# Patient Record
Sex: Male | Born: 1990 | Race: White | Hispanic: No | Marital: Single | Smoking: Current every day smoker
Health system: Southern US, Community
[De-identification: ages and names within clinical notes are randomized; demographics above are authoritative.]

---
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
## 2020-01-22 ENCOUNTER — Emergency Department (HOSPITAL_COMMUNITY)
Admission: EM | Admit: 2020-01-22 | Discharge: 2020-01-22 | Disposition: A | Discharge status: Home / Self Care | Payer: No Typology Code available for payment source | Attending: Emergency Medicine | Admitting: Emergency Medicine

## 2020-01-22 ENCOUNTER — Emergency Department (HOSPITAL_COMMUNITY): Payer: No Typology Code available for payment source

## 2020-01-22 ENCOUNTER — Encounter (HOSPITAL_COMMUNITY): Payer: Self-pay | Admitting: Emergency Medicine

## 2020-01-22 ENCOUNTER — Other Ambulatory Visit: Payer: Self-pay

## 2020-01-22 DIAGNOSIS — N23 Unspecified renal colic: Secondary | ICD-10-CM

## 2020-01-22 DIAGNOSIS — F17210 Nicotine dependence, cigarettes, uncomplicated: Secondary | ICD-10-CM | POA: Insufficient documentation

## 2020-01-22 DIAGNOSIS — R1030 Lower abdominal pain, unspecified: Secondary | ICD-10-CM | POA: Diagnosis present

## 2020-01-22 LAB — URINALYSIS, ROUTINE W REFLEX MICROSCOPIC
Bilirubin Urine: NEGATIVE
Glucose, UA: NEGATIVE mg/dL
Ketones, ur: 20 mg/dL — AB
Leukocytes,Ua: NEGATIVE
Nitrite: NEGATIVE
Protein, ur: 100 mg/dL — AB
RBC / HPF: >50 RBC/hpf — ABNORMAL HIGH (ref 0–5)
Specific Gravity, Urine: 1.026 (ref 1.005–1.030)
pH: 6 (ref 5.0–8.0)

## 2020-01-22 LAB — COMPREHENSIVE METABOLIC PANEL
ALT: 25 U/L (ref 0–44)
AST: 29 U/L (ref 15–41)
Albumin: 5.2 g/dL — ABNORMAL HIGH (ref 3.5–5.0)
Alkaline Phosphatase: 52 U/L (ref 38–126)
Anion gap: 8 (ref 5–15)
BUN: 21 mg/dL — ABNORMAL HIGH (ref 6–20)
CO2: 27 mmol/L (ref 22–32)
Calcium: 9.4 mg/dL (ref 8.9–10.3)
Chloride: 104 mmol/L (ref 98–111)
Creatinine, Ser: 1.02 mg/dL (ref 0.61–1.24)
GFR calc Af Amer: >60 mL/min (ref >60)
GFR calc non Af Amer: >60 mL/min (ref >60)
Glucose, Bld: 94 mg/dL (ref 70–99)
Potassium: 3.9 mmol/L (ref 3.5–5.1)
Sodium: 139 mmol/L (ref 135–145)
Total Bilirubin: 0.9 mg/dL (ref 0.3–1.2)
Total Protein: 8.2 g/dL — ABNORMAL HIGH (ref 6.5–8.1)

## 2020-01-22 LAB — CBC
HCT: 43.5 % (ref 39.0–52.0)
Hemoglobin: 14.9 g/dL (ref 13.0–17.0)
MCH: 30.5 pg (ref 26.0–34.0)
MCHC: 34.3 g/dL (ref 30.0–36.0)
MCV: 89 fL (ref 80.0–100.0)
Platelets: 270 10*3/uL (ref 150–400)
RBC: 4.89 MIL/uL (ref 4.22–5.81)
RDW: 12.8 % (ref 11.5–15.5)
WBC: 19.6 10*3/uL — ABNORMAL HIGH (ref 4.0–10.5)
nRBC: 0 % (ref 0.0–0.2)

## 2020-01-22 LAB — LIPASE, BLOOD: Lipase: 29 U/L (ref 11–51)

## 2020-01-22 MED ORDER — SODIUM CHLORIDE 0.9 % IV BOLUS
1000.0000 mL | Freq: Once | INTRAVENOUS | Status: AC
  Administered 2020-01-22: 1000 mL via INTRAVENOUS

## 2020-01-22 MED ORDER — OXYCODONE-ACETAMINOPHEN 5-325 MG PO TABS
1.0000 | ORAL_TABLET | Freq: Once | ORAL | Status: AC
  Administered 2020-01-22: 1 via ORAL
  Filled 2020-01-22: qty 1

## 2020-01-22 MED ORDER — ONDANSETRON HCL 4 MG PO TABS: 4.0000 mg | ORAL_TABLET | Freq: Three times a day (TID) | ORAL | 0 refills | Status: AC | PRN

## 2020-01-22 MED ORDER — TAMSULOSIN HCL 0.4 MG PO CAPS: 0.4000 mg | ORAL_CAPSULE | Freq: Every day | ORAL | 0 refills | Status: AC

## 2020-01-22 MED ORDER — KETOROLAC TROMETHAMINE 15 MG/ML IJ SOLN
15.0000 mg | Freq: Once | INTRAMUSCULAR | Status: AC
  Administered 2020-01-22: 15 mg via INTRAVENOUS
  Filled 2020-01-22: qty 1

## 2020-01-22 MED ORDER — ONDANSETRON HCL 4 MG/2ML IJ SOLN
4.0000 mg | Freq: Once | INTRAMUSCULAR | Status: AC
  Administered 2020-01-22: 4 mg via INTRAVENOUS
  Filled 2020-01-22: qty 2

## 2020-01-22 MED ORDER — HYDROCODONE-ACETAMINOPHEN 5-325 MG PO TABS: 2.0000 | ORAL_TABLET | ORAL | 0 refills | Status: AC | PRN

## 2020-01-22 NOTE — ED Provider Notes (Signed)
[facility]-EMERGENCY DEPT Provider Note   CSN: [id number] Arrival date & time: 01/22/20  1925     History Chief Complaint  Patient presents with  . Hematuria    Tyler Haas is a 29 y.o. male.  The history is provided by the patient. No language interpreter was used.  Hematuria   Tyler Haas is a 29 y.o. male who presents to the Emergency Department complaining of abdominal pain.  He started feeling poorly this morning with nausea.  Later in the day he began to develop left testicular pain ( around 9am).  Pain then began to worsen.  His urine started looking dark/bloody.  No dysuria.  No penile discharge.  No new sexual partners.  Sxs are severe and constant in nature.    Vapes.  No alcohol, drugs.      Past Medical History:  Diagnosis Date  . Migraine     There are no problems to display for this patient.   History reviewed. No pertinent surgical history.     No family history on file.  Social History   Tobacco Use  . Smoking status: Current Every Day Smoker    Packs/day: 0.50  Substance Use Topics  . Alcohol use: No  . Drug use: Yes    Types: Marijuana    Home Medications Prior to Admission medications   Medication Sig Start Date End Date Taking? Authorizing Provider  HYDROcodone-acetaminophen (NORCO/VICODIN) 5-325 MG tablet Take 2 tablets by mouth every 4 (four) hours as needed. 01/22/20   Tilden Fossa, MD  ketoprofen (ORUDIS) 50 MG capsule Take 50 mg by mouth every 4 (four) hours as needed. For migraine     [provider]  ondansetron (ZOFRAN) 4 MG tablet Take 1 tablet (4 mg total) by mouth every 8 (eight) hours as needed for nausea or vomiting. 01/22/20   Tilden Fossa, MD  tamsulosin (FLOMAX) 0.4 MG CAPS capsule Take 1 capsule (0.4 mg total) by mouth daily. 01/22/20   Tilden Fossa, MD    Allergies    Patient has no known allergies.  Review of Systems   Review of Systems  Genitourinary: Positive for  hematuria.  All other systems reviewed and are negative.   Physical Exam Updated Vital Signs BP 128/75 (BP Location: Left Arm)   Pulse (!) 50   Temp 98.8 F (37.1 C)   Resp 15   SpO2 99%   Physical Exam Vitals and nursing note reviewed.  Constitutional:      Appearance: He is well-developed.  HENT:     Head: Normocephalic and atraumatic.  Cardiovascular:     Rate and Rhythm: Normal rate and regular rhythm.     Heart sounds: No murmur heard.   Pulmonary:     Effort: Pulmonary effort is normal. No respiratory distress.     Breath sounds: Normal breath sounds.  Abdominal:     Palpations: Abdomen is soft.     Tenderness: There is no guarding or rebound.     Comments: Mild LLQ tenderness  Genitourinary:    Penis: Normal.      Testes: Normal.  Musculoskeletal:        General: No tenderness.  Skin:    General: Skin is warm and dry.  Neurological:     Mental Status: He is alert and oriented to person, place, and time.  Psychiatric:        Behavior: Behavior normal.     ED Results / Procedures / Treatments   Labs (all labs  ordered are listed, but only abnormal results are displayed) Labs Reviewed  COMPREHENSIVE METABOLIC PANEL - Abnormal; Notable for the following components:      Result Value   BUN 21 (*)    Total Protein 8.2 (*)    Albumin 5.2 (*)    All other components within normal limits  CBC - Abnormal; Notable for the following components:   WBC 19.6 (*)    All other components within normal limits  URINALYSIS, ROUTINE W REFLEX MICROSCOPIC - Abnormal; Notable for the following components:   APPearance CLOUDY (*)    Hgb urine dipstick LARGE (*)    Ketones, ur 20 (*)    Protein, ur 100 (*)    RBC / HPF >50 (*)    Bacteria, UA RARE (*)    All other components within normal limits  URINE CULTURE  LIPASE, BLOOD    EKG None  Radiology CT Renal Stone Study  Result Date: 01/22/2020 CLINICAL DATA:  Hematuria, abdominal pain, nausea, left testicular  pain EXAM: CT ABDOMEN AND PELVIS WITHOUT CONTRAST TECHNIQUE: Multidetector CT imaging of the abdomen and pelvis was performed following the standard protocol without IV contrast. COMPARISON:  01/22/2020 FINDINGS: Lower chest: No acute pleural or parenchymal lung disease. Hepatobiliary: No focal liver abnormality is seen. No gallstones, gallbladder wall thickening, or biliary dilatation. Pancreas: Unremarkable. No pancreatic ductal dilatation or surrounding inflammatory changes. Spleen: Normal in size without focal abnormality. Adrenals/Urinary Tract: There is a 2 mm obstructing proximal left ureteral calculus at the level of the UPJ, reference image 44/2. No other left-sided calculi. The right kidney is unremarkable. The adrenals are normal. Bladder is minimally distended with no filling defect. Stomach/Bowel: No bowel obstruction or ileus. No wall thickening or inflammatory change. Normal appendix right lower quadrant. Vascular/Lymphatic: No significant vascular findings are present. No enlarged abdominal or pelvic lymph nodes. Reproductive: Prostate is unremarkable. Other: No abdominal wall hernia or abnormality. No abdominopelvic ascites. Musculoskeletal: No acute or destructive bony lesions. 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 IMPRESSION: 1. Mild left-sided obstructive uropathy due to a 2 mm left UPJ calculus. 2. Otherwise no acute intra-abdominal or intrapelvic process. Electronically Signed   By: Sharlet Salina M.D.   On: 01/22/2020 22:36   US SCROTUM W/DOPPLER  Result Date: 01/22/2020 CLINICAL DATA:  Left testicular pain and hematuria. EXAM: SCROTAL ULTRASOUND DOPPLER ULTRASOUND OF THE TESTICLES TECHNIQUE: Complete ultrasound examination of the testicles, epididymis, and other scrotal structures was performed. Color and spectral Doppler ultrasound were also utilized to evaluate blood flow to the  testicles. COMPARISON:  None. FINDINGS: Right testicle Measurements: 4.8 x 2.1 x 2.8 cm. Volume 15 cubic cm. Normal appearance without evidence of mass or inflammatory disease. Normal color flow. Left testicle Measurements:  4.3 x 2.1 x 2.8 cm.  Volume 13 cubic cm. Normal appearance without evidence of mass or inflammatory disease. Normal color flow. Right epididymis:  Normal in size and appearance. Left epididymis:  Normal in size and appearance. Hydrocele:  None visualized. Varicocele:  None visualized. Pulsed Doppler interrogation of both testes demonstrates normal low resistance arterial and venous waveforms bilaterally. IMPRESSION: Normal scrotal ultrasound. Symmetric testicles and epididymi. No mass, cyst, hyperemia, hydrocele or varicocele. Electronically Signed   By: Paulina Fusi M.D.   On: 01/22/2020 21:37    Procedures Procedures (including critical care time)  Medications Ordered in ED Medications  sodium chloride 0.9 % bolus 1,000 mL (1,000 mLs Intravenous New  Bag/Given 01/22/20 2208)  ketorolac (TORADOL) 15 MG/ML injection 15 mg (15 mg Intravenous Given 01/22/20 2208)  ondansetron (ZOFRAN) injection 4 mg (4 mg Intravenous Given 01/22/20 2207)  oxyCODONE-acetaminophen (PERCOCET/ROXICET) 5-325 MG per tablet 1 tablet (1 tablet Oral Given 01/22/20 2310)    ED Course  I have reviewed the triage vital signs and the nursing notes.  Pertinent labs & imaging results that were available during my care of the patient were reviewed by me and considered in my medical decision making (see chart for details).    MDM Rules/Calculators/A&P                         Patient here for evaluation of left testicle pain and lower abdominal pain. He is non-toxic appearing on evaluation. CBC with leukocytosis. UA not consistent with UTI. Testicular ultrasound is negative for acute abnormality. CT stone study demonstrates a 2 mm distal ureteral stone. Discussed with patient findings of studies. Following  treatment in the emergency department he is feeling improved. Discussed home care for stone, outpatient follow-up, return precautions.  Final Clinical Impression(s) / ED Diagnoses Final diagnoses:  Renal colic on left side    Rx / DC Orders ED Discharge Orders         Ordered    tamsulosin (FLOMAX) 0.4 MG CAPS capsule  Daily     Discontinue  Reprint     01/22/20 2316    HYDROcodone-acetaminophen (NORCO/VICODIN) 5-325 MG tablet  Every 4 hours PRN     Discontinue  Reprint     01/22/20 2316    ondansetron (ZOFRAN) 4 MG tablet  Every 8 hours PRN     Discontinue  Reprint     01/22/20 2316           Tilden Fossa, MD 01/22/20 2317

## 2020-01-22 NOTE — Discharge Instructions (Addendum)
You have a 2 mm stone in your left ureter. Get rechecked immediately if you develop fevers, cannot urinate or develop uncontrolled pain.

## 2020-01-22 NOTE — ED Notes (Signed)
Patient given strainer for urine.

## 2020-01-22 NOTE — ED Triage Notes (Signed)
Patient reports hematuria with abdominal pain and nausea today. Also reports pain to left testicle today.

## 2020-01-23 LAB — URINE CULTURE: Culture: <10000 — AB

## 2021-12-21 IMAGING — CT CT RENAL STONE PROTOCOL
2 of 4 series · 16 of 46 positions shown, 18 images · non-contrast
Comparison: 01/22/2020

CLINICAL DATA: Hematuria, abdominal pain, nausea, left testicular
pain

EXAM:
CT ABDOMEN AND PELVIS WITHOUT CONTRAST
TECHNIQUE: Multidetector CT imaging of the abdomen and pelvis was performed
following the standard protocol without IV contrast.

[Series 2: axial st · axial · 0.85mm/px · z∈[+1012,+1492]mm · 13 of 108 slices shown, 15 images]
[im 6/108  soft-tissue]
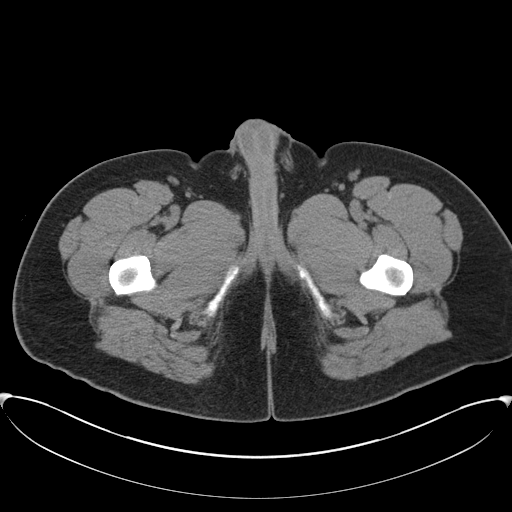
[im 6/108  bone]
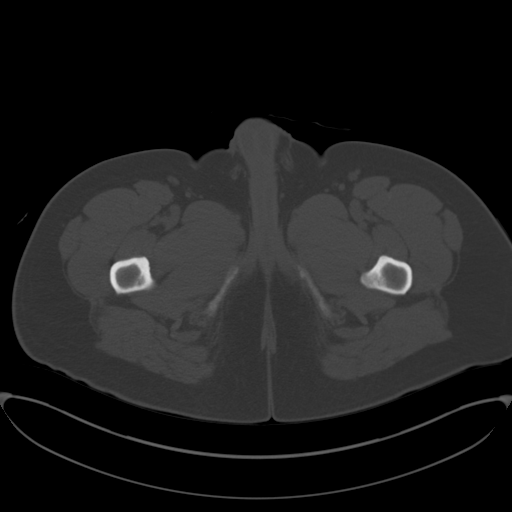
[im 17/108  soft-tissue]
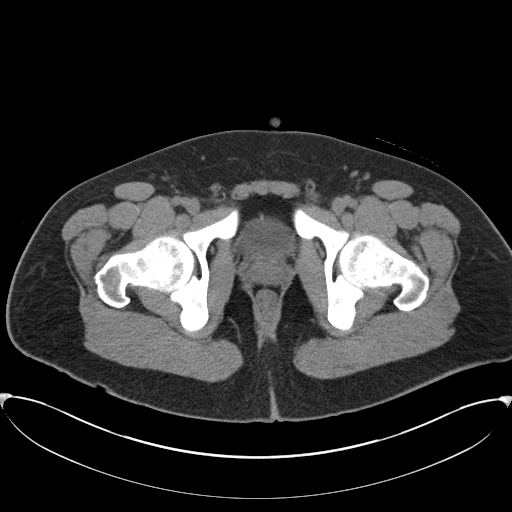
[im 23/108  soft-tissue]
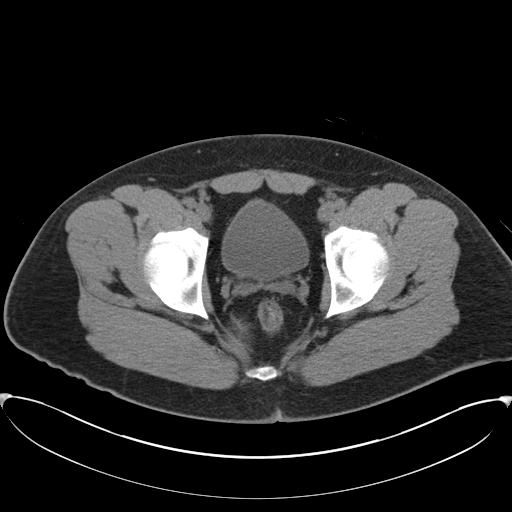
[im 29/108  soft-tissue]
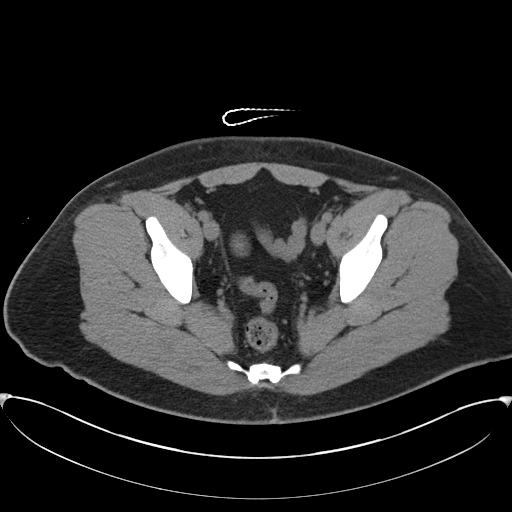
[im 40/108  soft-tissue]
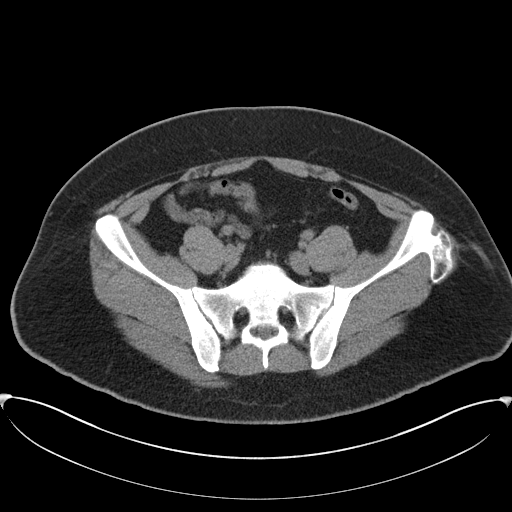
[im 46/108  soft-tissue]
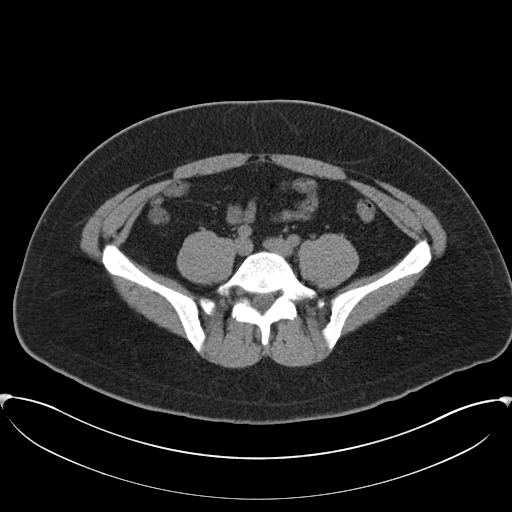
[im 57/108  soft-tissue]
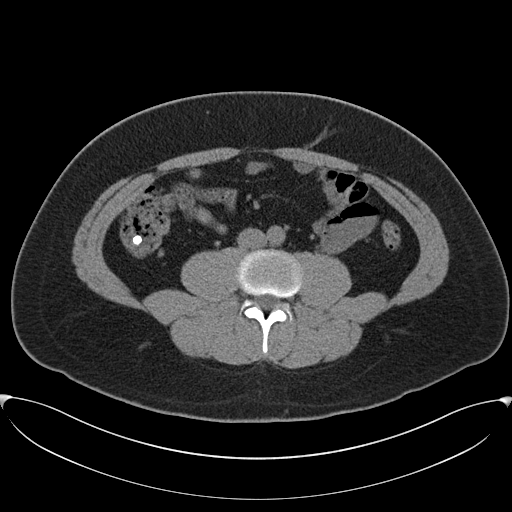
[im 62/108  soft-tissue]
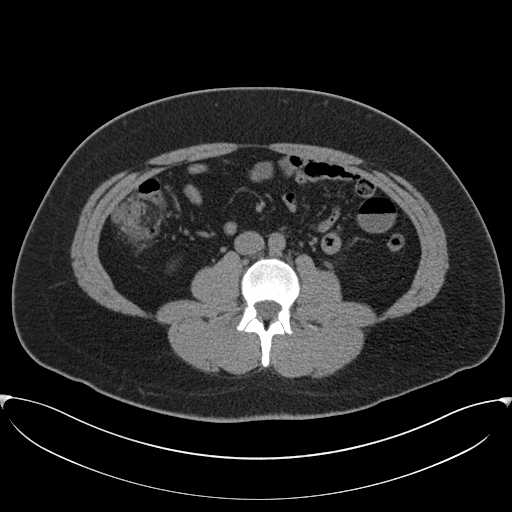
[im 68/108  soft-tissue]
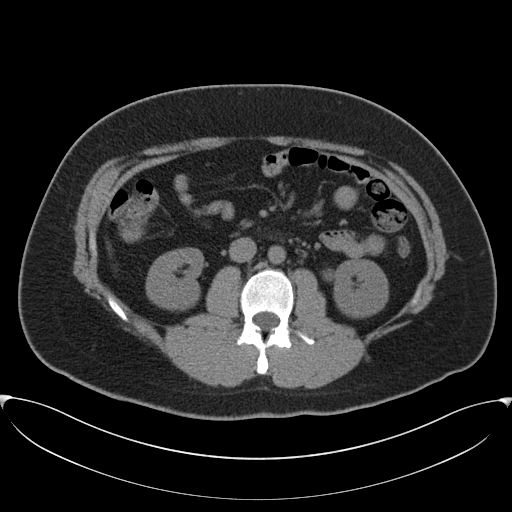
[im 68/108  bone]
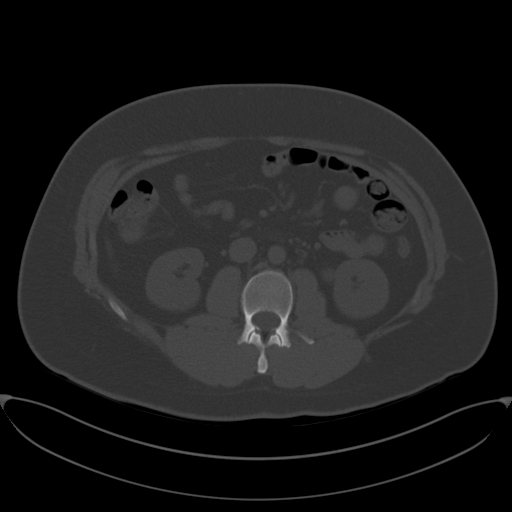
[im 79/108  soft-tissue]
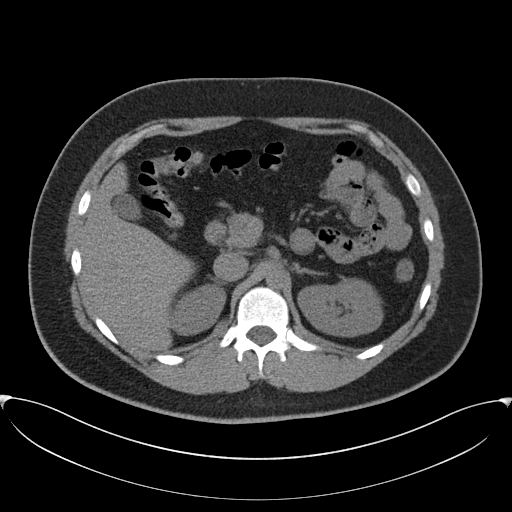
[im 85/108  soft-tissue]
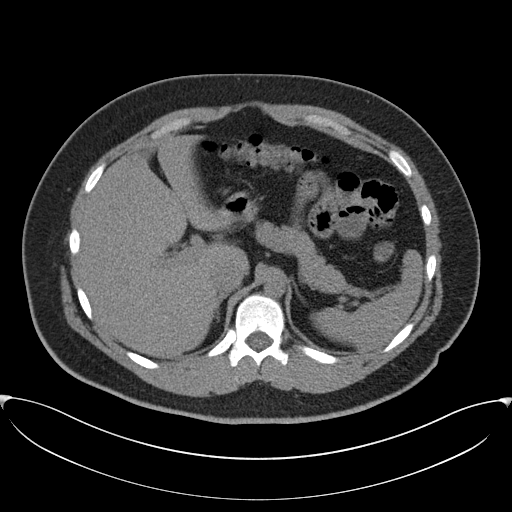
[im 91/108  soft-tissue]
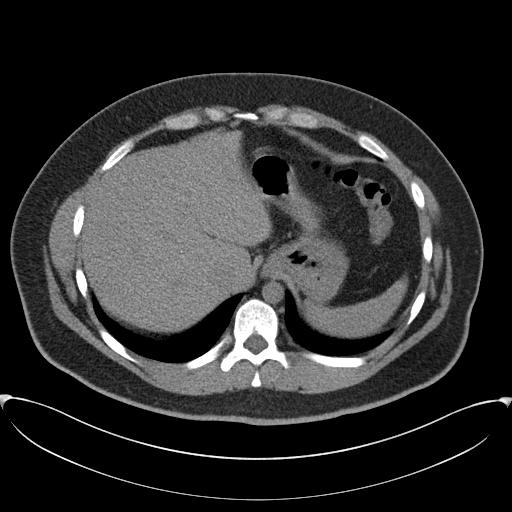
[im 102/108  soft-tissue]
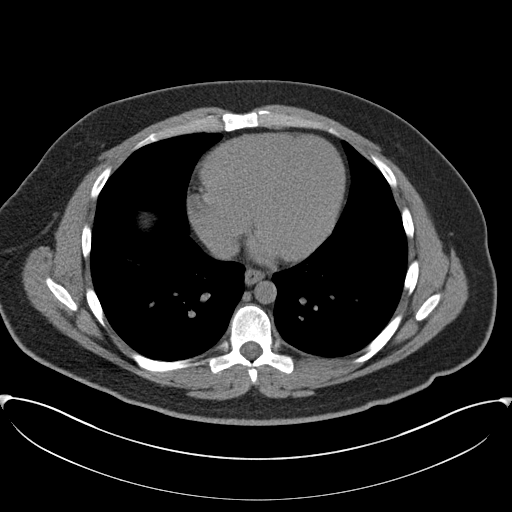

[Series 5: coronal · coronal · 0.80mm/px · 3 of 151 slices shown]
[im 51/151  soft-tissue]
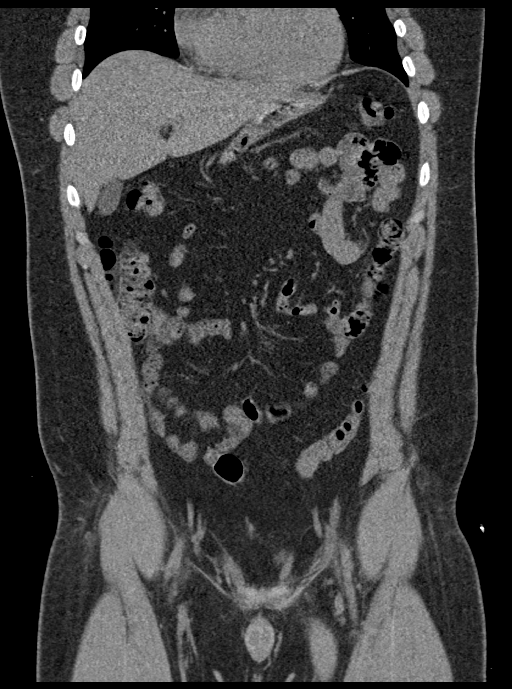
[im 67/151  soft-tissue]
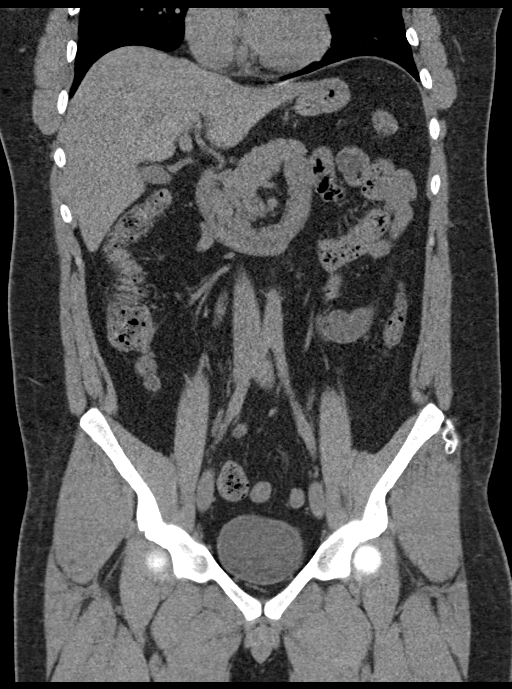
[im 84/151  soft-tissue]
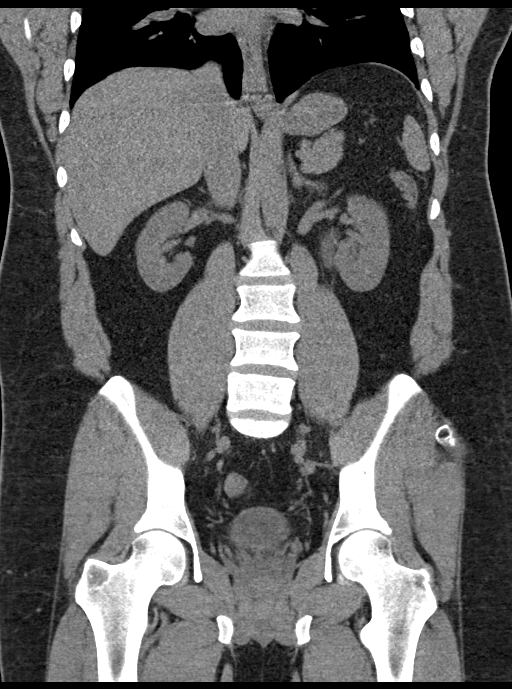

[16 of 46 positions shown; findings below may reference images not displayed]

FINDINGS: Lower chest: No acute pleural or parenchymal lung disease.

Hepatobiliary: No focal liver abnormality is seen. No gallstones,
gallbladder wall thickening, or biliary dilatation.

Pancreas: Unremarkable. No pancreatic ductal dilatation or
surrounding inflammatory changes.

Spleen: Normal in size without focal abnormality.

Adrenals/Urinary Tract: There is a 2 mm obstructing proximal left
ureteral calculus at the level of the UPJ, reference image 44/2. No
other left-sided calculi.

The right kidney is unremarkable. The adrenals are normal. Bladder
is minimally distended with no filling defect.

Stomach/Bowel: No bowel obstruction or ileus. No wall thickening or
inflammatory change. Normal appendix right lower quadrant.

Vascular/Lymphatic: No significant vascular findings are present. No
enlarged abdominal or pelvic lymph nodes.

Reproductive: Prostate is unremarkable.

Other: No abdominal wall hernia or abnormality. No abdominopelvic
ascites.

Musculoskeletal: No acute or destructive bony lesions. Incidental
osteo chondroma off the left iliac wing. Reconstructed images
demonstrate unilateral right pars defect at L5 without
spondylolisthesis.
IMPRESSION: 1. Mild left-sided obstructive uropathy due to a 2 mm left UPJ
calculus.
2. Otherwise no acute intra-abdominal or intrapelvic process.

## 2021-12-21 IMAGING — US US SCROTUM W/ DOPPLER COMPLETE
1 series · 14 of 25 positions shown · non-contrast
Comparison: None.

CLINICAL DATA: Left testicular pain and hematuria.

EXAM:
SCROTAL ULTRASOUND
DOPPLER ULTRASOUND OF THE TESTICLES
TECHNIQUE: Complete ultrasound examination of the testicles, epididymis, and
other scrotal structures was performed. Color and spectral Doppler
ultrasound were also utilized to evaluate blood flow to the
testicles.

[Series 1: us scrotum w/ doppler complete · 14 of 43 slices shown]
[im 1/43]
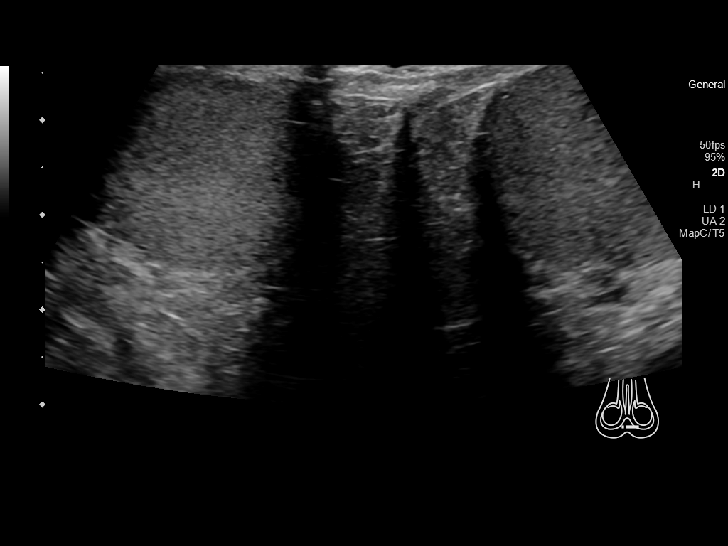
[im 4/43]
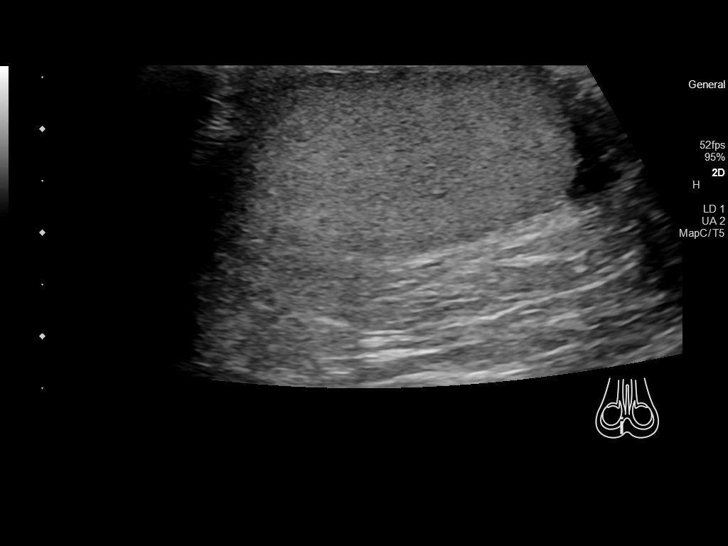
[im 8/43]
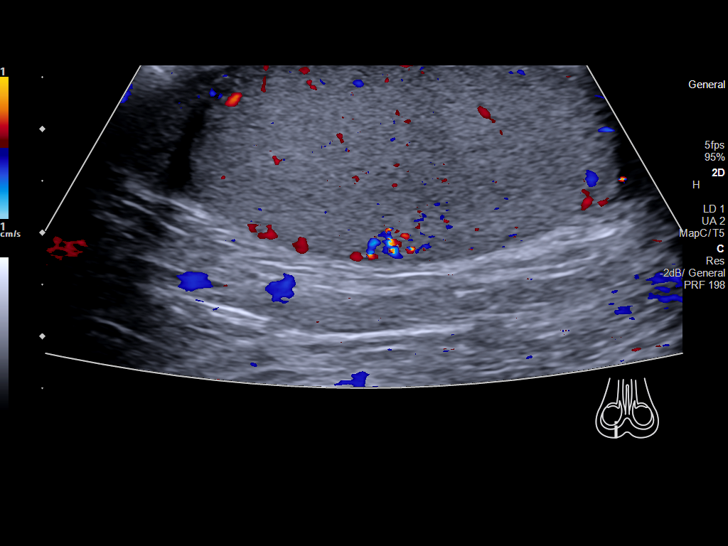
[im 11/43]
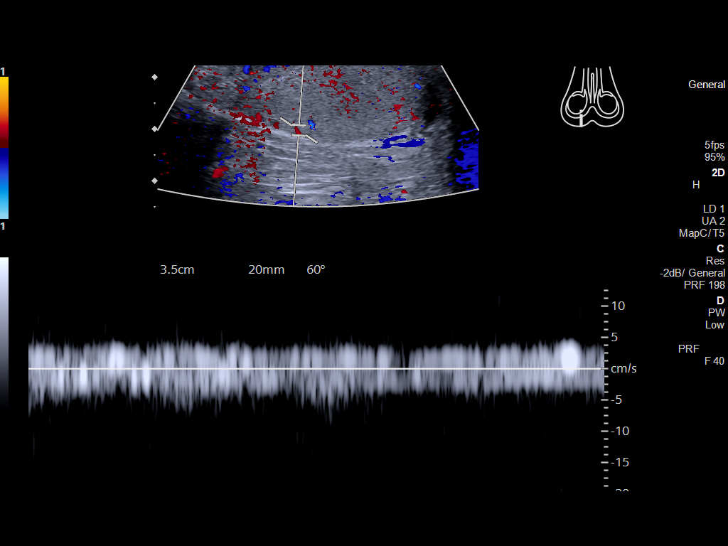
[im 15/43]
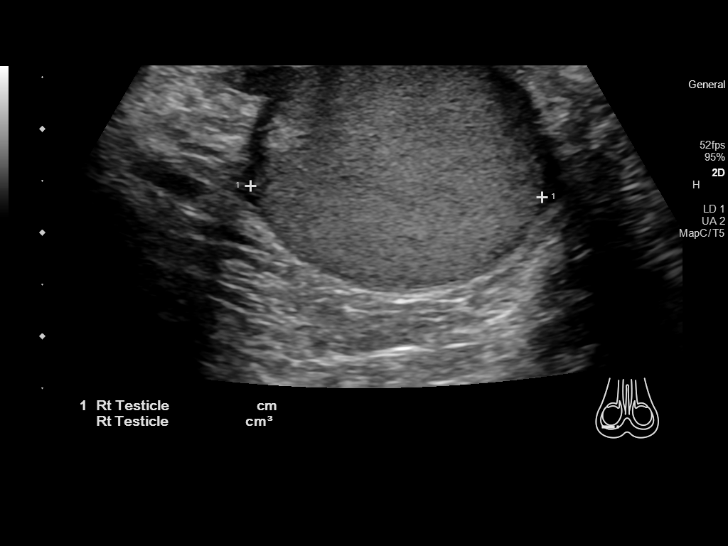
[im 16/43]
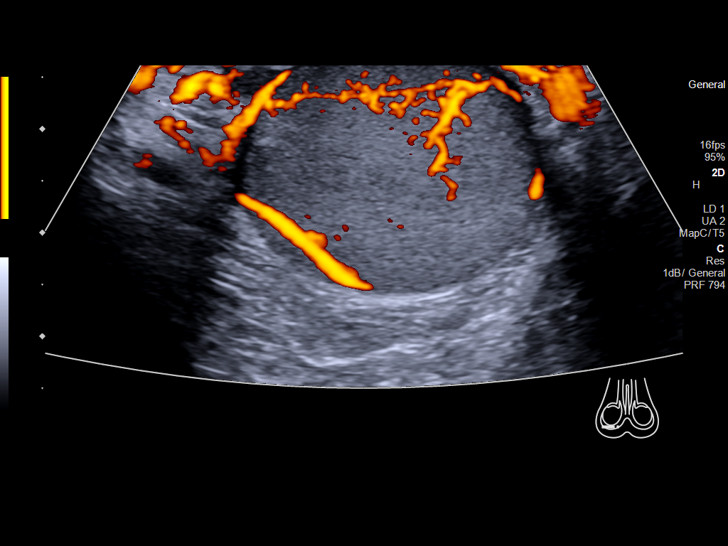
[im 20/43]
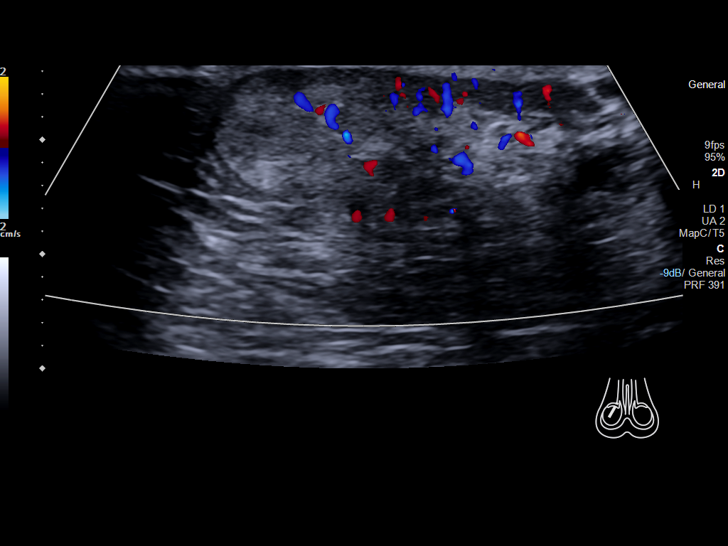
[im 23/43]
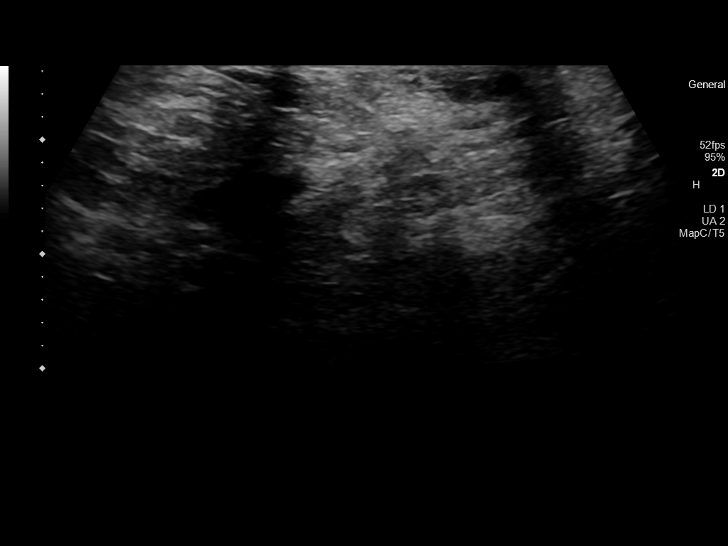
[im 27/43]
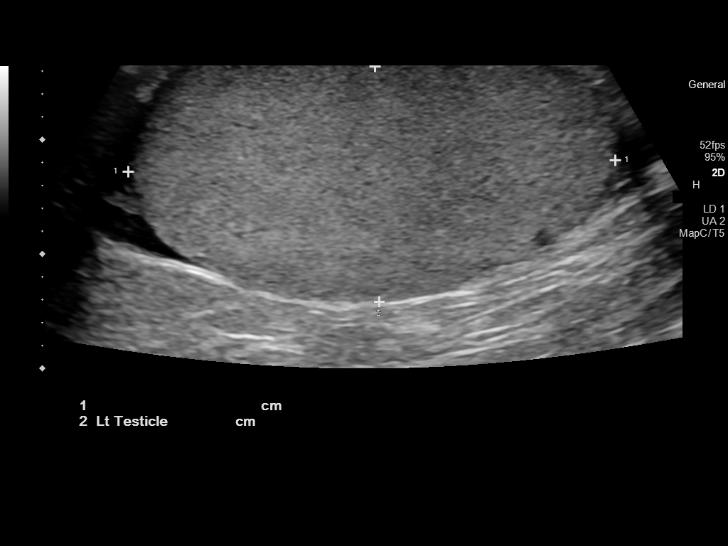
[im 29/43]
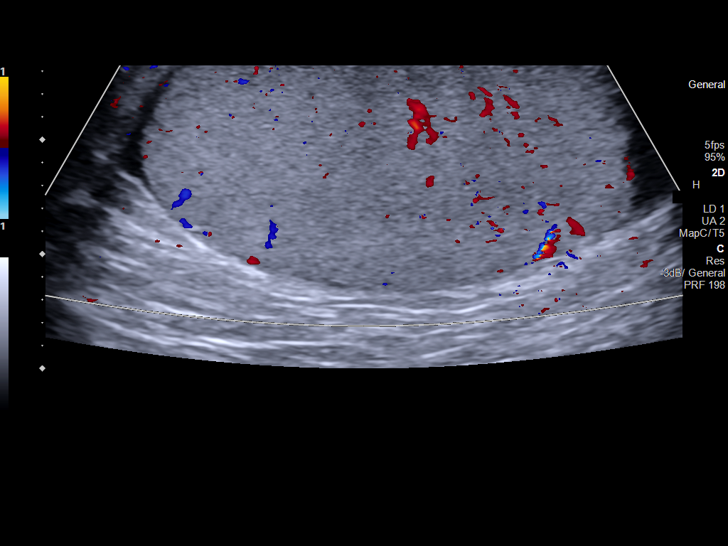
[im 32/43]
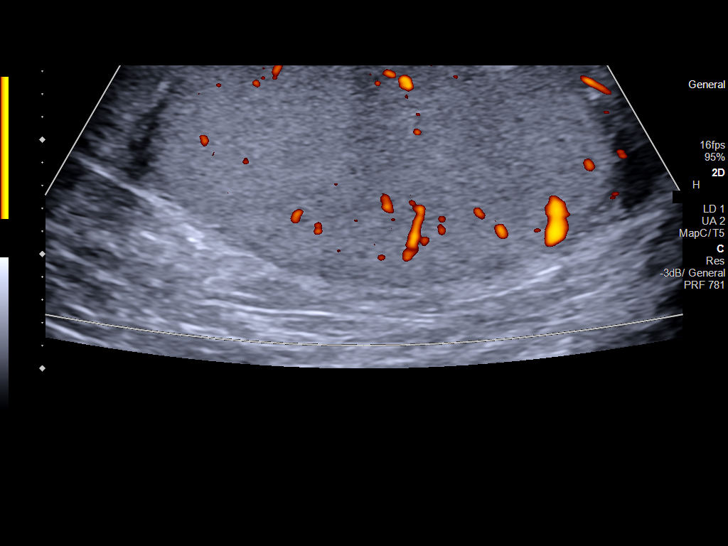
[im 36/43]
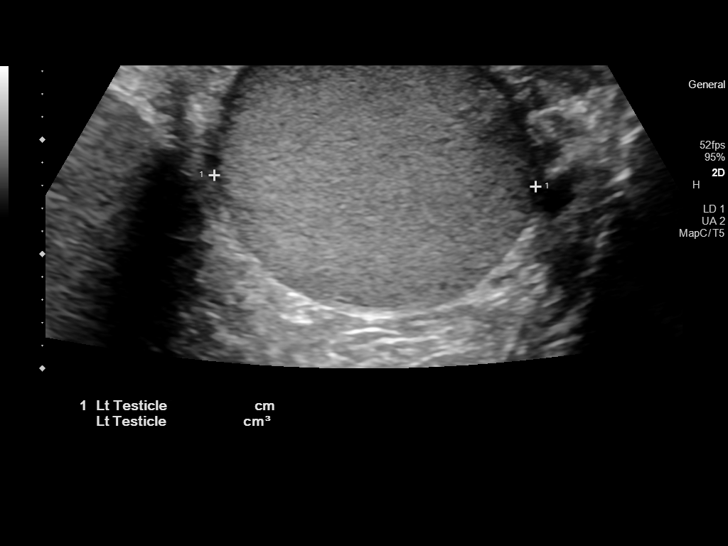
[im 39/43]
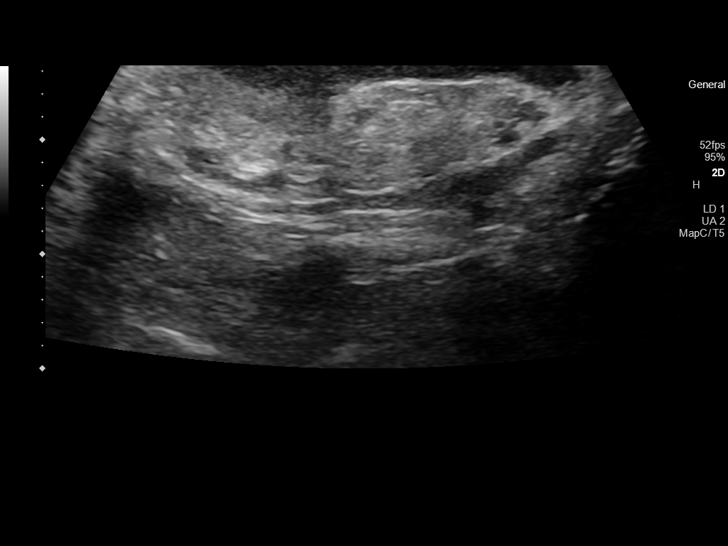
[im 43/43]
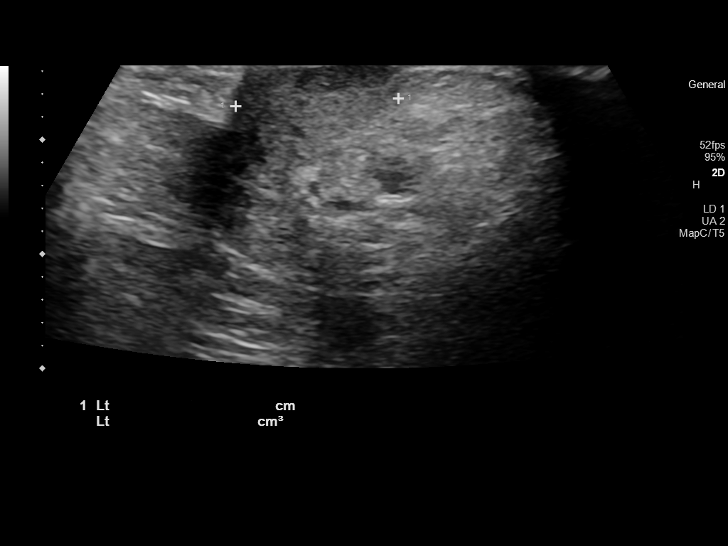

[14 of 25 positions shown; findings below may reference images not displayed]

FINDINGS: Right testicle

Measurements: 4.8 x 2.1 x 2.8 cm. Volume 15 cubic cm. Normal
appearance without evidence of mass or inflammatory disease. Normal
color flow.

Left testicle

Measurements:  4.3 x 2.1 x 2.8 cm.  Volume 13 cubic cm.

Normal appearance without evidence of mass or inflammatory disease.
Normal color flow.

Right epididymis:  Normal in size and appearance.

Left epididymis:  Normal in size and appearance.

Hydrocele:  None visualized.

Varicocele:  None visualized.

Pulsed Doppler interrogation of both testes demonstrates normal low
resistance arterial and venous waveforms bilaterally.
IMPRESSION: Normal scrotal ultrasound. Symmetric testicles and epididymi. No
mass, cyst, hyperemia, hydrocele or varicocele.
